# Patient Record
Sex: Male | Born: 1980 | Race: White | Hispanic: No | Marital: Single | Smoking: Never smoker
Health system: Southern US, Community
[De-identification: ages and names within clinical notes are randomized; demographics above are authoritative.]

## PROBLEM LIST (undated history)

## (undated) DIAGNOSIS — K5190 Ulcerative colitis, unspecified, without complications: Secondary | ICD-10-CM

---

## 2019-06-15 ENCOUNTER — Ambulatory Visit
Admission: EM | Admit: 2019-06-15 | Discharge: 2019-06-15 | Disposition: A | Discharge status: Home / Self Care | Payer: BC Managed Care – PPO

## 2019-06-15 ENCOUNTER — Other Ambulatory Visit: Payer: Self-pay

## 2019-06-15 ENCOUNTER — Encounter: Payer: Self-pay | Admitting: Emergency Medicine

## 2019-06-15 ENCOUNTER — Ambulatory Visit (INDEPENDENT_AMBULATORY_CARE_PROVIDER_SITE_OTHER): Payer: BC Managed Care – PPO

## 2019-06-15 DIAGNOSIS — W01198A Fall on same level from slipping, tripping and stumbling with subsequent striking against other object, initial encounter: Secondary | ICD-10-CM | POA: Diagnosis not present

## 2019-06-15 DIAGNOSIS — S301XXA Contusion of abdominal wall, initial encounter: Secondary | ICD-10-CM

## 2019-06-15 DIAGNOSIS — R197 Diarrhea, unspecified: Secondary | ICD-10-CM | POA: Diagnosis not present

## 2019-06-15 HISTORY — DX: Ulcerative colitis, unspecified, without complications: K51.90

## 2019-06-15 LAB — COMPREHENSIVE METABOLIC PANEL
ALT: 19 U/L (ref 0–44)
AST: 27 U/L (ref 15–41)
Albumin: 4.3 g/dL (ref 3.5–5.0)
Alkaline Phosphatase: 58 U/L (ref 38–126)
Anion gap: 13 (ref 5–15)
BUN: 12 mg/dL (ref 6–20)
CO2: 26 mmol/L (ref 22–32)
Calcium: 9 mg/dL (ref 8.9–10.3)
Chloride: 98 mmol/L (ref 98–111)
Creatinine, Ser: 1.06 mg/dL (ref 0.61–1.24)
GFR calc Af Amer: >60 mL/min (ref >60)
GFR calc non Af Amer: >60 mL/min (ref >60)
Glucose, Bld: 98 mg/dL (ref 70–99)
Potassium: 4.4 mmol/L (ref 3.5–5.1)
Sodium: 137 mmol/L (ref 135–145)
Total Bilirubin: 0.6 mg/dL (ref 0.3–1.2)
Total Protein: 7.2 g/dL (ref 6.5–8.1)

## 2019-06-15 LAB — CBC WITH DIFFERENTIAL/PLATELET
Abs Immature Granulocytes: 0.02 10*3/uL (ref 0.00–0.07)
Basophils Absolute: 0 10*3/uL (ref 0.0–0.1)
Basophils Relative: 1 %
Eosinophils Absolute: 0 10*3/uL (ref 0.0–0.5)
Eosinophils Relative: 1 %
HCT: 42.1 % (ref 39.0–52.0)
Hemoglobin: 14.2 g/dL (ref 13.0–17.0)
Immature Granulocytes: 0 %
Lymphocytes Relative: 27 %
Lymphs Abs: 1.5 10*3/uL (ref 0.7–4.0)
MCH: 30 pg (ref 26.0–34.0)
MCHC: 33.7 g/dL (ref 30.0–36.0)
MCV: 89 fL (ref 80.0–100.0)
Monocytes Absolute: 0.4 10*3/uL (ref 0.1–1.0)
Monocytes Relative: 8 %
Neutro Abs: 3.6 10*3/uL (ref 1.7–7.7)
Neutrophils Relative %: 63 %
Platelets: 242 10*3/uL (ref 150–400)
RBC: 4.73 MIL/uL (ref 4.22–5.81)
RDW: 12.2 % (ref 11.5–15.5)
WBC: 5.6 10*3/uL (ref 4.0–10.5)
nRBC: 0 % (ref 0.0–0.2)

## 2019-06-15 MED ORDER — ONDANSETRON 8 MG PO TBDP: 8.0000 mg | ORAL_TABLET | Freq: Two times a day (BID) | ORAL | 0 refills | Status: AC

## 2019-06-15 MED ORDER — LIDOCAINE 4 % EX PTCH: 1.0000 | MEDICATED_PATCH | Freq: Every day | CUTANEOUS | 0 refills | Status: AC

## 2019-06-15 NOTE — ED Triage Notes (Signed)
Patient c/o left sided abdominal pain and diarrhea for the past 4 days. Patient states that [facility] and was Negative.  Patient reports low grade fever. Patient states that he fell on cement blocks on Wed.  Patient states that he fell on his left arm and left side.

## 2019-06-15 NOTE — ED Provider Notes (Signed)
[facility]    CSN: [id number] Arrival date & time: 06/15/19  1326      History   Chief Complaint Chief Complaint  Patient presents with  . Abdominal Pain    left  . Diarrhea    HPI Albert Wilson is a 38 y.o. male.   HPI  -year-old male presents with symptoms of diarrhea with 5 bowel movements daily that is watery but with some form.  He has had headache generalized abdominal pain and loss of appetite.  He has a history of ulcerative colitis but has been in remission for many years.  He underwent Covid testing the [facility] on Wednesday reports that that was negative.  He has had a low-grade fever usually in the 99's never above 101.  Had nausea but never any vomiting.  He states that he fell on a parking slab on Wednesday and yesterday morning awoke with severe pain in his left side where he hit against the curb.  He also injured his left arm and left hip.  Temperature today is 99.1 pulse rate of 81 blood pressure 128/94 respirations 16 O2 sats 100% on room air.       Past Medical History:  Diagnosis Date  . Ulcerative colitis ([place])     There are no active problems to display for this patient.   History reviewed. No pertinent surgical history.     Home Medications    Prior to Admission medications   Medication Sig Start Date End Date Taking? Authorizing Provider  ALPRAZolam Duanne Moron) 1 MG tablet Take 1 mg by mouth 3 (three) times daily as needed. 06/10/19   [provider]  Lidocaine (HM LIDOCAINE PATCH) 4 % PTCH Apply 1 patch topically daily. 06/15/19   Lorin Picket, PA-C  omeprazole (PRILOSEC) 40 MG capsule Take 40 mg by mouth daily. 06/06/19   [provider]  ondansetron (ZOFRAN ODT) 8 MG disintegrating tablet Take 1 tablet (8 mg total) by mouth 2 (two) times daily. 06/15/19   Lorin Picket, PA-C    Family History Family History  Problem Relation Age of Onset  . Dementia Mother   . Diabetes Father   . Hypertension  Father   . Hyperlipidemia Father     Social History Social History   Tobacco Use  . Smoking status: Never Smoker  . Smokeless tobacco: Never Used  Substance Use Topics  . Alcohol use: Not Currently  . Drug use: Never     Allergies   Patient has no known allergies.   Review of Systems Review of Systems  Constitutional: Positive for activity change, appetite change, chills and fatigue. Negative for fever.  Gastrointestinal: Positive for abdominal pain and diarrhea. Negative for blood in stool and vomiting.  Skin: Positive for color change.     Physical Exam Triage Vital Signs ED Triage Vitals  Enc Vitals Group     BP 06/15/19 1343 (!) 128/94     Pulse Rate 06/15/19 1343 81     Resp 06/15/19 1343 16     Temp 06/15/19 1343 99.1 F (37.3 C)     Temp Source 06/15/19 1343 Oral     SpO2 06/15/19 1343 100 %     Weight 06/15/19 1339 210 lb (95.3 kg)     Height 06/15/19 1339 6\' 3"  (1.905 m)     Head Circumference --      Peak Flow --      Pain Score 06/15/19 1339 5     Pain  Loc --      Pain Edu? --      Excl. in GC? --    No data found.  Updated Vital Signs BP (!) 128/94 (BP Location: Right Arm)   Pulse 81   Temp 99.1 F (37.3 C) (Oral)   Resp 16   Ht 6\' 3"  (1.905 m)   Wt 210 lb (95.3 kg)   SpO2 100%   BMI 26.25 kg/m   Visual Acuity Right Eye Distance:   Left Eye Distance:   Bilateral Distance:    Right Eye Near:   Left Eye Near:    Bilateral Near:     Physical Exam Vitals signs and nursing note reviewed.  Constitutional:      General: He is not in acute distress.    Appearance: He is well-developed and normal weight. He is not ill-appearing, toxic-appearing or diaphoretic.  HENT:     Head: Normocephalic.  Cardiovascular:     Rate and Rhythm: Normal rate and regular rhythm.     Heart sounds: Normal heart sounds.  Pulmonary:     Effort: Pulmonary effort is normal.     Breath sounds: Normal breath sounds.  Abdominal:     General: Abdomen is  flat. Bowel sounds are normal. There is no distension. There are signs of injury.     Palpations: Abdomen is soft.     Tenderness: There is abdominal tenderness in the left upper quadrant and left lower quadrant. There is guarding. There is no right CVA tenderness, left CVA tenderness or rebound.  Skin:    General: Skin is warm and dry.  Neurological:     General: No focal deficit present.     Mental Status: He is alert and oriented to person, place, and time.  Psychiatric:        Mood and Affect: Mood normal.        Behavior: Behavior normal.      UC Treatments / Results  Labs (all labs ordered are listed, but only abnormal results are displayed) Labs Reviewed  CBC WITH DIFFERENTIAL/PLATELET  COMPREHENSIVE METABOLIC PANEL    EKG   Radiology Dg Abd 2 Views  Result Date: 06/15/2019 CLINICAL DATA:  06/17/2019 in parking lot EXAM: ABDOMEN - 2 VIEW COMPARISON:  None. FINDINGS: Lung bases are clear. Nonobstructed bowel-gas pattern. No radiopaque calculi. Phleboliths in the pelvis. IMPRESSION: Negative. Electronically Signed   By: Larey Seat M.D.   On: 06/15/2019 15:26    Procedures Procedures (including critical care time)  Medications Ordered in UC Medications - No data to display  Initial Impression / Assessment and Plan / UC Course  I have reviewed the triage vital signs and the nursing notes.  Pertinent labs & imaging results that were available during my care of the patient were reviewed by me and considered in my medical decision making (see chart for details).   38 year old male presents with left-sided abdominal pain that started yesterday following a fall injury sustained on Wednesday 4 days prior to this visit.  The fall he tripped over a parking block landing on his left arm which was shoved into his upper left abdomen.  He has a bruise on his left hip and  abrasions on his left arm.  His other complaint is that of nausea and diarrhea.  The diarrhea has been for 5-6  episodes daily without blood or mucus in the stool.  Is a watery consistency but has formed.  He has a prior history of IBS.  States  he has been in remission for quite some time and is not currently taking any medications.  Told him that the area should be self-limiting.  To to increase his fluid intake to compensate for the losses and diarrhea.  We will provide him with Zofran for the nausea.  Nominal pain that he has is more likely on the basis of a contusion from the fall that he has sustained.  Use ice on the area and have also provided him with lidocaine patches.  I reviewed his x-rays today which were normal.  He has any problems he should follow-up with her primary care physician.     Final Clinical Impressions(s) / UC Diagnoses   Final diagnoses:  Diarrhea of presumed infectious origin  Contusion of abdominal wall, initial encounter     Discharge Instructions     Apply ice 20 minutes out of every 2 hours 4-5 times daily for comfort.     ED Prescriptions    Medication Sig Dispense Auth. Provider   ondansetron (ZOFRAN ODT) 8 MG disintegrating tablet Take 1 tablet (8 mg total) by mouth 2 (two) times daily. 6 tablet Ovid Curdoemer, Farra Nikolic P, PA-C   Lidocaine (HM LIDOCAINE PATCH) 4 % PTCH Apply 1 patch topically daily. 10 patch Lutricia Feiloemer, Tykisha Areola P, PA-C     PDMP not reviewed this encounter.   Lutricia FeilRoemer, Seleen Walter P, PA-C 06/15/19 1705

## 2019-06-15 NOTE — Discharge Instructions (Signed)
Apply ice 20 minutes out of every 2 hours 4-5 times daily for comfort.  °

## 2020-12-13 IMAGING — CR DG ABDOMEN 2V
3 series · 3 of 3 positions shown · non-contrast
Comparison: None.

CLINICAL DATA: Fell in parking lot

EXAM:
ABDOMEN - 2 VIEW

[abdomen erect]
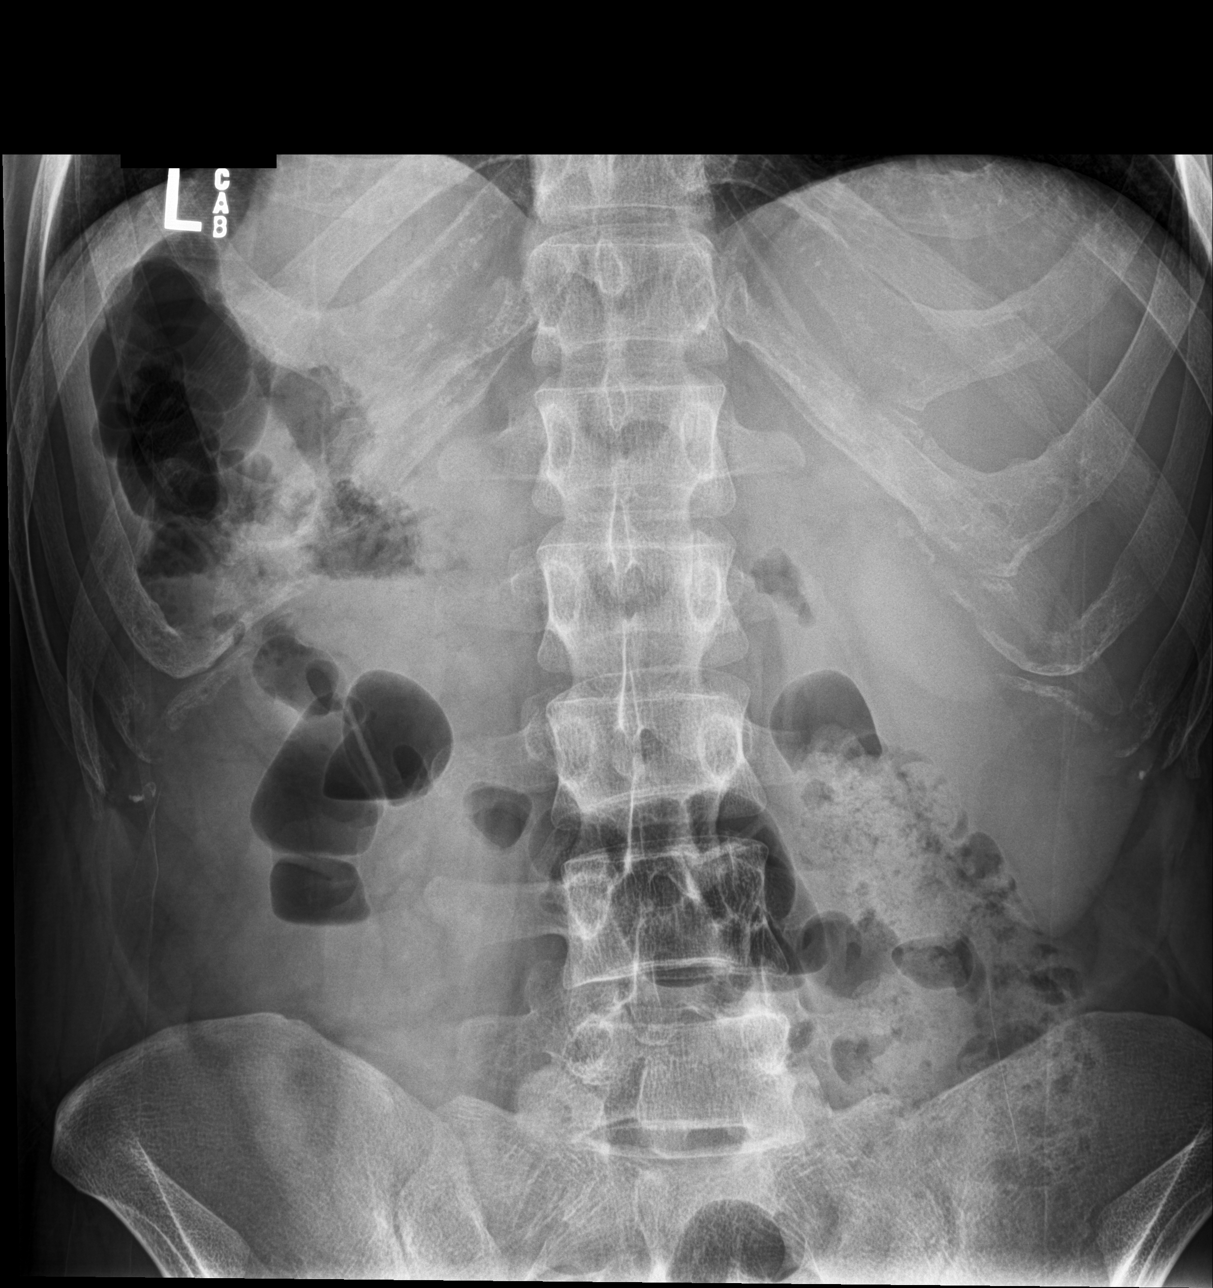

[abdomen supine (1 of 2)]
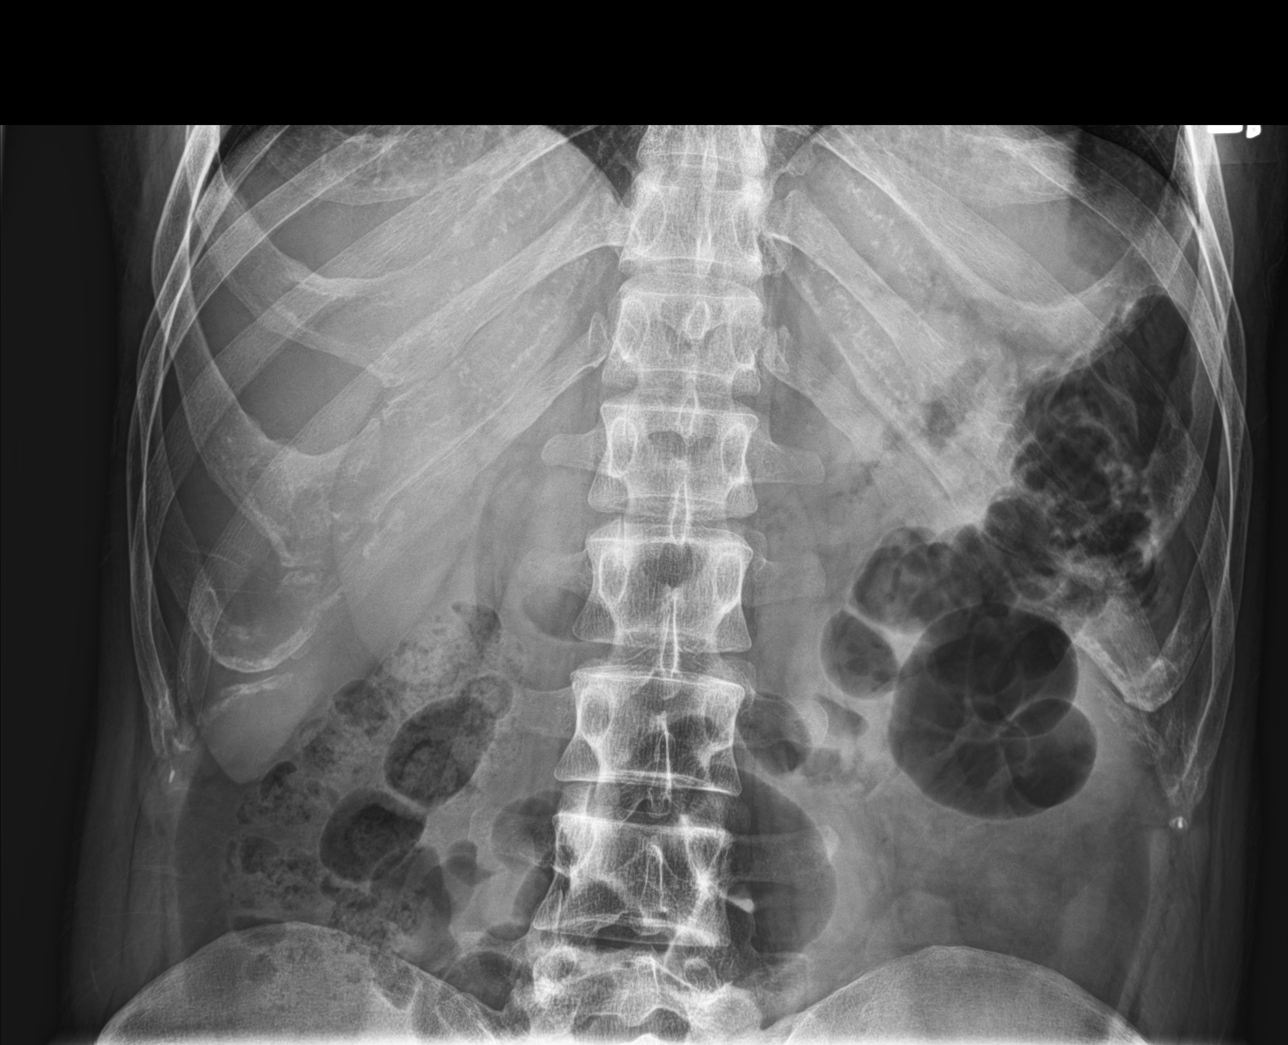

[abdomen supine (2 of 2)]
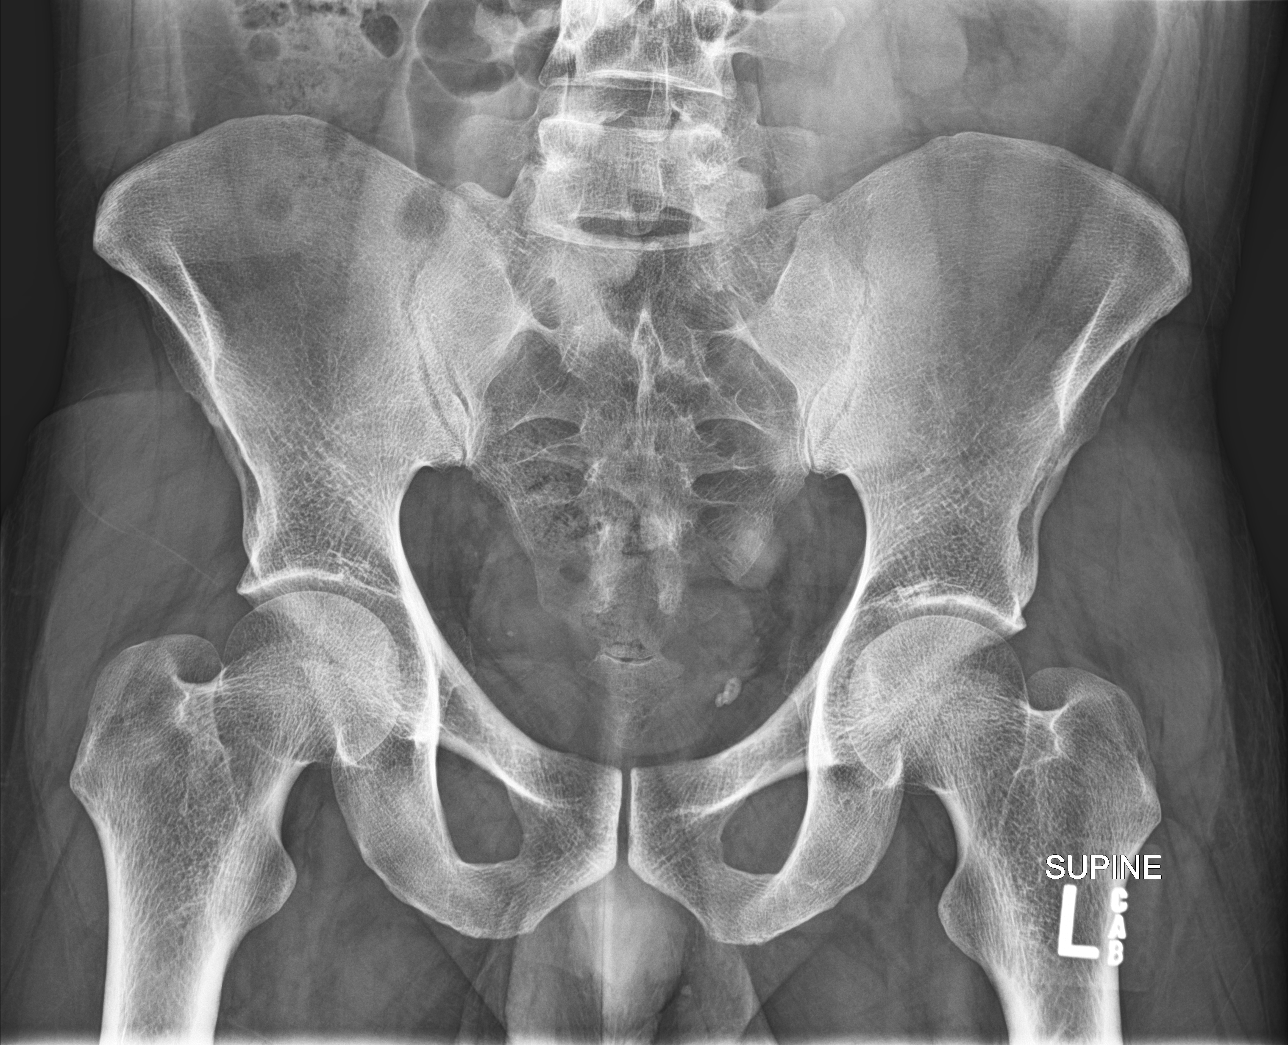

[3 of 3 positions shown; findings below may reference images not displayed]

FINDINGS: Lung bases are clear. Nonobstructed bowel-gas pattern. No radiopaque
calculi. Phleboliths in the pelvis.
IMPRESSION: Negative.
